# Patient Record
Sex: Male | Born: 1985 | Race: Black or African American | Hispanic: No | Marital: Single | Smoking: Current every day smoker
Health system: Southern US, Community
[De-identification: ages and names within clinical notes are randomized; demographics above are authoritative.]

## PROBLEM LIST (undated history)

## (undated) DIAGNOSIS — F329 Major depressive disorder, single episode, unspecified: Secondary | ICD-10-CM

## (undated) DIAGNOSIS — F32A Depression, unspecified: Secondary | ICD-10-CM

---

## 2018-10-08 ENCOUNTER — Encounter (HOSPITAL_COMMUNITY): Payer: Self-pay

## 2018-10-08 ENCOUNTER — Emergency Department (HOSPITAL_COMMUNITY)
Admission: EM | Admit: 2018-10-08 | Discharge: 2018-10-09 | Disposition: A | Discharge status: Other Acute Inpatient Hospital | Payer: Self-pay | Attending: Emergency Medicine | Admitting: Emergency Medicine

## 2018-10-08 ENCOUNTER — Other Ambulatory Visit: Payer: Self-pay

## 2018-10-08 DIAGNOSIS — F32A Depression, unspecified: Secondary | ICD-10-CM

## 2018-10-08 DIAGNOSIS — F17210 Nicotine dependence, cigarettes, uncomplicated: Secondary | ICD-10-CM | POA: Insufficient documentation

## 2018-10-08 DIAGNOSIS — R45851 Suicidal ideations: Secondary | ICD-10-CM | POA: Insufficient documentation

## 2018-10-08 DIAGNOSIS — F1099 Alcohol use, unspecified with unspecified alcohol-induced disorder: Secondary | ICD-10-CM | POA: Insufficient documentation

## 2018-10-08 DIAGNOSIS — F1910 Other psychoactive substance abuse, uncomplicated: Secondary | ICD-10-CM | POA: Insufficient documentation

## 2018-10-08 DIAGNOSIS — F329 Major depressive disorder, single episode, unspecified: Secondary | ICD-10-CM | POA: Insufficient documentation

## 2018-10-08 LAB — COMPREHENSIVE METABOLIC PANEL
ALK PHOS: 45 U/L (ref 38–126)
ALT: 11 U/L (ref 0–44)
AST: 16 U/L (ref 15–41)
Albumin: 4.2 g/dL (ref 3.5–5.0)
Anion gap: 8 (ref 5–15)
BUN: 16 mg/dL (ref 6–20)
CO2: 28 mmol/L (ref 22–32)
CREATININE: 1.16 mg/dL (ref 0.61–1.24)
Calcium: 9 mg/dL (ref 8.9–10.3)
Chloride: 104 mmol/L (ref 98–111)
GFR calc Af Amer: >60 mL/min (ref >60)
GFR calc non Af Amer: >60 mL/min (ref >60)
Glucose, Bld: 80 mg/dL (ref 70–99)
Potassium: 3.8 mmol/L (ref 3.5–5.1)
Sodium: 140 mmol/L (ref 135–145)
Total Bilirubin: 1 mg/dL (ref 0.3–1.2)
Total Protein: 7.3 g/dL (ref 6.5–8.1)

## 2018-10-08 LAB — CBC
HEMATOCRIT: 43.4 % (ref 39.0–52.0)
Hemoglobin: 14.1 g/dL (ref 13.0–17.0)
MCH: 29.6 pg (ref 26.0–34.0)
MCHC: 32.5 g/dL (ref 30.0–36.0)
MCV: 91.2 fL (ref 80.0–100.0)
Platelets: 218 10*3/uL (ref 150–400)
RBC: 4.76 MIL/uL (ref 4.22–5.81)
RDW: 11.9 % (ref 11.5–15.5)
WBC: 3.4 10*3/uL — ABNORMAL LOW (ref 4.0–10.5)
nRBC: 0 % (ref 0.0–0.2)

## 2018-10-08 LAB — RAPID URINE DRUG SCREEN, HOSP PERFORMED
Amphetamines: NOT DETECTED
Barbiturates: NOT DETECTED
Benzodiazepines: NOT DETECTED
Cocaine: POSITIVE — AB
Opiates: NOT DETECTED
Tetrahydrocannabinol: NOT DETECTED

## 2018-10-08 LAB — SALICYLATE LEVEL: Salicylate Lvl: <7 mg/dL (ref 2.8–30.0)

## 2018-10-08 LAB — ACETAMINOPHEN LEVEL: Acetaminophen (Tylenol), Serum: <10 ug/mL — ABNORMAL LOW (ref 10–30)

## 2018-10-08 LAB — ETHANOL: Alcohol, Ethyl (B): <10 mg/dL (ref <10)

## 2018-10-08 MED ORDER — ACETAMINOPHEN 325 MG PO TABS: 650.0000 mg | ORAL_TABLET | ORAL | Status: DC | PRN

## 2018-10-08 MED ORDER — NICOTINE 21 MG/24HR TD PT24: 21.0000 mg | MEDICATED_PATCH | Freq: Every day | TRANSDERMAL | Status: DC

## 2018-10-08 MED ORDER — ALUM & MAG HYDROXIDE-SIMETH 200-200-20 MG/5ML PO SUSP: 30.0000 mL | Freq: Four times a day (QID) | ORAL | Status: DC | PRN

## 2018-10-08 NOTE — ED Provider Notes (Signed)
[facility] EMERGENCY DEPARTMENT Provider Note   CSN: [id number] Arrival date & time: 10/08/18  1127    History   Chief Complaint Chief Complaint  Patient presents with  . V70.1    HPI Keith Patton is a 33 y.o. male.     Patient reports depression for uncertain length of time with questionable suicidal ideation.  He does not have a specific plan.  He also states it is unlikely he would ever carry it out.  1 previous psychiatric admission for depression when he was in [employer] approximately 5 years ago.  He is a daily drinker and uses cocaine.  Severity is moderate.     History reviewed. No pertinent past medical history.  There are no active problems to display for this patient.   History reviewed. No pertinent surgical history.      Home Medications    Prior to Admission medications   Not on File    Family History No family history on file.  Social History Social History   Tobacco Use  . Smoking status: Current Every Day Smoker    Packs/day: 0.50  . Smokeless tobacco: Never Used  Substance Use Topics  . Alcohol use: Yes    Comment: Everyday. 6-12 beers  . Drug use: Yes    Types: Cocaine    Comment: Everyday. Has not used today. Last used Sunday.      Allergies   Patient has no allergy information on record.   Review of Systems Review of Systems  All other systems reviewed and are negative.    Physical Exam Updated Vital Signs BP 116/73 (BP Location: Right Arm)   Pulse 75   Temp 98.3 F (36.8 C) (Oral)   Resp 16   Ht 6\' 2"  (1.88 m)   Wt 79.4 kg   SpO2 100%   BMI 22.47 kg/m   Physical Exam Vitals signs and nursing note reviewed.  Constitutional:      Appearance: He is well-developed.  HENT:     Head: Normocephalic and atraumatic.  Eyes:     Conjunctiva/sclera: Conjunctivae normal.  Neck:     Musculoskeletal: Neck supple.  Cardiovascular:     Rate and Rhythm: Normal rate and regular rhythm.  Pulmonary:     Effort:  Pulmonary effort is normal.     Breath sounds: Normal breath sounds.  Abdominal:     General: Bowel sounds are normal.     Palpations: Abdomen is soft.  Musculoskeletal: Normal range of motion.  Skin:    General: Skin is warm and dry.  Neurological:     Mental Status: He is alert and oriented to person, place, and time.  Psychiatric:     Comments: depressed      ED Treatments / Results  Labs (all labs ordered are listed, but only abnormal results are displayed) Labs Reviewed  CBC - Abnormal; Notable for the following components:      Result Value   WBC 3.4 (*)    All other components within normal limits  RAPID URINE DRUG SCREEN, HOSP PERFORMED - Abnormal; Notable for the following components:   Cocaine POSITIVE (*)    All other components within normal limits  COMPREHENSIVE METABOLIC PANEL  ETHANOL  SALICYLATE LEVEL  ACETAMINOPHEN LEVEL    EKG None  Radiology No results found.  Procedures Procedures (including critical care time)  Medications Ordered in ED Medications - No data to display   Initial Impression / Assessment and Plan / ED Course  I have reviewed the triage vital signs and the nursing notes.  Pertinent labs & imaging results that were available during my care of the patient were reviewed by me and considered in my medical decision making (see chart for details).        Patient complains of depression and substance abuse.  He is not psychotic.  Will obtain behavioral health consult.  Final Clinical Impressions(s) / ED Diagnoses   Final diagnoses:  Depression, unspecified depression type    ED Discharge Orders    None       Donnetta Hutching, MD 10/08/18 1342
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
## 2018-10-08 NOTE — ED Notes (Signed)
Security at bedside to wand. Belongings given to pt's friend who will take them. Pt's friend with purse and other personal belongings. Asked by security to take to vehicle.

## 2018-10-08 NOTE — ED Notes (Signed)
TTS consult in process. 

## 2018-10-08 NOTE — ED Triage Notes (Signed)
Pt has been having suicidal thoughts for the last few weeks. Has a history of mental health issues. Pt states he has had triggers previously for this, but will not disclose that information. Pt has not made any plan.

## 2018-10-09 ENCOUNTER — Other Ambulatory Visit: Payer: Self-pay

## 2018-10-09 ENCOUNTER — Encounter (HOSPITAL_COMMUNITY): Payer: Self-pay | Admitting: *Deleted

## 2018-10-09 ENCOUNTER — Inpatient Hospital Stay (HOSPITAL_COMMUNITY)
Admission: AD | Admit: 2018-10-09 | Discharge: 2018-10-13 | DRG: 885 | Disposition: A | Discharge status: Home / Self Care | Payer: Federal, State, Local not specified - Other | Source: Intra-hospital | Attending: Psychiatry | Admitting: Psychiatry

## 2018-10-09 DIAGNOSIS — F10239 Alcohol dependence with withdrawal, unspecified: Secondary | ICD-10-CM | POA: Diagnosis present

## 2018-10-09 DIAGNOSIS — R45851 Suicidal ideations: Secondary | ICD-10-CM | POA: Diagnosis present

## 2018-10-09 DIAGNOSIS — F1410 Cocaine abuse, uncomplicated: Secondary | ICD-10-CM | POA: Diagnosis present

## 2018-10-09 DIAGNOSIS — F322 Major depressive disorder, single episode, severe without psychotic features: Secondary | ICD-10-CM | POA: Diagnosis not present

## 2018-10-09 DIAGNOSIS — F17210 Nicotine dependence, cigarettes, uncomplicated: Secondary | ICD-10-CM | POA: Diagnosis present

## 2018-10-09 DIAGNOSIS — F339 Major depressive disorder, recurrent, unspecified: Principal | ICD-10-CM | POA: Diagnosis present

## 2018-10-09 DIAGNOSIS — F1010 Alcohol abuse, uncomplicated: Secondary | ICD-10-CM

## 2018-10-09 HISTORY — DX: Depression, unspecified: F32.A

## 2018-10-09 HISTORY — DX: Major depressive disorder, single episode, unspecified: F32.9

## 2018-10-09 MED ORDER — MAGNESIUM HYDROXIDE 400 MG/5ML PO SUSP: 30.0000 mL | Freq: Every day | ORAL | Status: DC | PRN

## 2018-10-09 MED ORDER — CHLORDIAZEPOXIDE HCL 25 MG PO CAPS
25.0000 mg | ORAL_CAPSULE | ORAL | Status: AC
  Administered 2018-10-11 – 2018-10-12 (×2): 25 mg via ORAL
  Filled 2018-10-09 (×2): qty 1

## 2018-10-09 MED ORDER — HYDROXYZINE HCL 25 MG PO TABS
25.0000 mg | ORAL_TABLET | Freq: Four times a day (QID) | ORAL | Status: AC | PRN
  Administered 2018-10-11: 25 mg via ORAL
  Filled 2018-10-09 (×2): qty 1

## 2018-10-09 MED ORDER — VITAMIN B-1 100 MG PO TABS
100.0000 mg | ORAL_TABLET | Freq: Every day | ORAL | Status: DC
  Administered 2018-10-10 – 2018-10-13 (×4): 100 mg via ORAL
  Filled 2018-10-09 (×6): qty 1

## 2018-10-09 MED ORDER — CHLORDIAZEPOXIDE HCL 25 MG PO CAPS
25.0000 mg | ORAL_CAPSULE | Freq: Three times a day (TID) | ORAL | Status: AC
  Administered 2018-10-11: 25 mg via ORAL
  Filled 2018-10-09 (×2): qty 1

## 2018-10-09 MED ORDER — CHLORDIAZEPOXIDE HCL 25 MG PO CAPS: 25.0000 mg | ORAL_CAPSULE | Freq: Four times a day (QID) | ORAL | Status: AC | PRN

## 2018-10-09 MED ORDER — ADULT MULTIVITAMIN W/MINERALS CH
1.0000 | ORAL_TABLET | Freq: Every day | ORAL | Status: DC
  Administered 2018-10-09 – 2018-10-13 (×5): 1 via ORAL
  Filled 2018-10-09 (×7): qty 1

## 2018-10-09 MED ORDER — LOPERAMIDE HCL 2 MG PO CAPS: 2.0000 mg | ORAL_CAPSULE | ORAL | Status: AC | PRN

## 2018-10-09 MED ORDER — ALUM & MAG HYDROXIDE-SIMETH 200-200-20 MG/5ML PO SUSP: 30.0000 mL | ORAL | Status: DC | PRN

## 2018-10-09 MED ORDER — ONDANSETRON 4 MG PO TBDP: 4.0000 mg | ORAL_TABLET | Freq: Four times a day (QID) | ORAL | Status: AC | PRN

## 2018-10-09 MED ORDER — BUPROPION HCL ER (XL) 150 MG PO TB24
150.0000 mg | ORAL_TABLET | Freq: Every day | ORAL | Status: DC
  Administered 2018-10-09 – 2018-10-11 (×3): 150 mg via ORAL
  Filled 2018-10-09 (×4): qty 1

## 2018-10-09 MED ORDER — ENSURE ENLIVE PO LIQD
237.0000 mL | Freq: Two times a day (BID) | ORAL | Status: DC
  Administered 2018-10-10 – 2018-10-13 (×8): 237 mL via ORAL

## 2018-10-09 MED ORDER — HYDROXYZINE HCL 25 MG PO TABS
25.0000 mg | ORAL_TABLET | Freq: Three times a day (TID) | ORAL | Status: DC | PRN
  Administered 2018-10-10 – 2018-10-12 (×2): 25 mg via ORAL
  Filled 2018-10-09: qty 1

## 2018-10-09 MED ORDER — CHLORDIAZEPOXIDE HCL 25 MG PO CAPS
25.0000 mg | ORAL_CAPSULE | Freq: Four times a day (QID) | ORAL | Status: AC
  Administered 2018-10-09 – 2018-10-10 (×3): 25 mg via ORAL
  Filled 2018-10-09 (×3): qty 1

## 2018-10-09 MED ORDER — TRAZODONE HCL 50 MG PO TABS
50.0000 mg | ORAL_TABLET | Freq: Every evening | ORAL | Status: DC | PRN
  Administered 2018-10-10 – 2018-10-12 (×3): 50 mg via ORAL
  Filled 2018-10-09 (×3): qty 1

## 2018-10-09 MED ORDER — CHLORDIAZEPOXIDE HCL 25 MG PO CAPS
25.0000 mg | ORAL_CAPSULE | Freq: Every day | ORAL | Status: AC
  Administered 2018-10-13: 25 mg via ORAL
  Filled 2018-10-09: qty 1

## 2018-10-09 MED ORDER — ACETAMINOPHEN 325 MG PO TABS: 650.0000 mg | ORAL_TABLET | Freq: Four times a day (QID) | ORAL | Status: DC | PRN

## 2018-10-09 MED ORDER — THIAMINE HCL 100 MG/ML IJ SOLN: 100.0000 mg | Freq: Once | INTRAMUSCULAR | Status: DC

## 2018-10-09 NOTE — ED Notes (Signed)
TTS consult in progress. °

## 2018-10-09 NOTE — Progress Notes (Signed)
Keith Patton is a 33 year old male pt admitted on voluntary basis. On admission, he does endorse depression, substance abuse and suicidal thoughts but able to contract for safety in the hospital. He reports that he has been having thoughts but has not actually done anything. He also reports daily alcohol usage and also reports frequent cocaine use. He reports that his last drink was two days ago and he does not display any overt signs or symptoms of withdrawal on admission. 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 He also spoke about a recent breakup of a relationship. He reports that he was hospitalized once prior in 2016 and was also on medications at that time but reports that he has not taken them since then. He reports that he lives alone and reports that he will return there once he is discharged. Keith Patton was escorted to the unit, oriented to the milieu and safety maintained.

## 2018-10-09 NOTE — ED Notes (Signed)
[facility] here to transport patient to [facility]. 

## 2018-10-09 NOTE — Progress Notes (Signed)
Pt accepted to [facility], Bed 300-1 Reola Calkins, NP is the accepting provider.  Nehemiah Massed, NP is the attending provider.  Call report to [phone]  [facility] notified.   Pt is Voluntary.  Pt may be transported by [facility]  Pt scheduled  to arrive at [facility] at 14:30  Carney Bern T. Kaylyn Lim, MSW, LCSWA Disposition Clinical Social Work [phone] (cell) [phone] (office)

## 2018-10-09 NOTE — Progress Notes (Signed)
Nursing Progress Note: 7p-7a D: Pt currently presents with a pleasant/anxious affect and behavior. Pt states "I am feeling better. I just gotta stay off of drugs. I need to regain on my drug weight loss and get my shit together." Interacting appropriately with the milieu. Pt reports good sleep during the previous night with current medication regimen. Pt did attend wrap-up group.  A: Pt provided with medications per providers orders. Pt's labs and vitals were monitored throughout the night. Pt supported emotionally and encouraged to express concerns and questions. Pt educated on medications.  R: Pt's safety ensured with 15 minute and environmental checks. Pt currently denies SI, HI, and AVH. Pt verbally contracts to seek staff if SI,HI, or AVH occurs and to consult with staff before acting on any harmful thoughts. Will continue to monitor.

## 2018-10-09 NOTE — Progress Notes (Signed)
Patient is seen by me via tele-psych and I have consulted with Dr. Lucianne Muss.  Patient reports long history of depression.  He reports that over the last 2 weeks his alcohol intake and cocaine use has increased.  He states he drinks alcohol daily of approximately 6-8 beers a day on average along with cocaine use 2-3 times a week.  He reports that over the last week he has had suicidal thoughts daily.  He reports that he did go to the [place] to seek assistance and was told for me to help to come to the hospital.  Patient seems to be reaching out for help and patient is open to coming into the hospital voluntarily to seek treatment.  Due to patient's risk factors of increased suicidal thoughts over the last week, substance abuse, and seeking help at multiple facilities feel the patient would best benefit from being inpatient and patient agrees.

## 2018-10-09 NOTE — Tx Team (Signed)
Initial Treatment Plan 10/09/2018 2:47 PM Keith Patton OXB:[id number]    PATIENT STRESSORS: Loss of relationship Occupational concerns   PATIENT STRENGTHS: Ability for insight Average or above average intelligence Capable of independent living Communication skills General fund of knowledge Motivation for treatment/growth Physical Health   PATIENT IDENTIFIED PROBLEMS: Depression Suicidal thoughts Substance Abuse "find the root cause of this depression" "I would like to be sober"                     DISCHARGE CRITERIA:  Ability to meet basic life and health needs Improved stabilization in mood, thinking, and/or behavior Reduction of life-threatening or endangering symptoms to within safe limits Verbal commitment to aftercare and medication compliance  PRELIMINARY DISCHARGE PLAN: Attend aftercare/continuing care group Placement in alternative living arrangements  PATIENT/FAMILY INVOLVEMENT: This treatment plan has been presented to and reviewed with the patient, Keith Patton, and/or family member, .  The patient and family have been given the opportunity to ask questions and make suggestions.  Keith Patton, [place], [place] 10/09/2018, 2:47 PM

## 2018-10-09 NOTE — Progress Notes (Signed)
Pt attend wrap up group. 

## 2018-10-10 DIAGNOSIS — F322 Major depressive disorder, single episode, severe without psychotic features: Secondary | ICD-10-CM

## 2018-10-10 LAB — TSH: TSH: 1.563 u[IU]/mL (ref 0.350–4.500)

## 2018-10-10 NOTE — [facility] Counselor (Signed)
Adult Comprehensive Assessment  Patient ID: Keymoni Kamradt, male   DOB: [birth year], 33 y.o.   MRN: [id number]  Information Source: Information source: Patient  Current Stressors:  Patient states their primary concerns and needs for treatment are:: increased depression over past 1-2 mo, weight loss, isolating from friends/family, increased cocaine/thc/alcohol abuse Patient states their goals for this hospitilization and ongoing recovery are:: "to get to the root cause of my depression and to get sober."  Educational / Learning stressors: high school education Employment / Job issues: Production designer, theatre/television/film at [employer] Family Relationships: parents are deceased; younger brother just graduated from college-- some contact. son  Surveyor, quantity / Lack of resources (include bankruptcy): income from employment. working on getting connected to [employer] / Lack of housing: lives alone in apt in [place], [place] Physical health (include injuries & life threatening diseases): none identified Social relationships: fair-some friends in the area. son lives out of state. some interaction with brother; parents deceased. single Substance abuse: pt reports that he has been drinking heavily and using cocaine 3-4x weekly for the past few months. some marijuana use as well. Pt reports drug/alcohol use was rare prior to 2 months ago Bereavement / Loss: mother died 2016/12/17 and father died in 12-18-11  Living/Environment/Situation:  Living Arrangements: Alone Living conditions (as described by patient or guardian): lives alone in apt Who else lives in the home?: alone` How long has patient lived in current situation?: 1 1/2 years What is atmosphere in current home: Comfortable  Family History:  Marital status: Single Are you sexually active?: Yes What is your sexual orientation?: heterosexual Has your sexual activity been affected by drugs, alcohol, medication, or emotional stress?: n/a  Does patient have children?: Yes How many children?:  1 How is patient's relationship with their children?: 33yo son who lives in [place] with his mother. "I don't get to see him much but I talk to him on the phone."   Childhood History:  By whom was/is the patient raised?: Father Additional childhood history information: "My mom had addiction issues so my dad was my primary caretaker. He was a single dad and didn't really get help from other family. He did the best he could with [place]."  Description of patient's relationship with caregiver when they were a child: close to father; strained from mother due to her addiction issues in his childhood. His mother was not around often Patient's description of current relationship with people who raised him/her: father died 12/18/2011 and mother died in 12/17/16. "my relationship with my mom got better up until her death."  How were you disciplined when you got in trouble as a child/adolescent?: grounded;  Does patient have siblings?: Yes Number of Siblings: 1 Description of patient's current relationship with siblings: younger brother. "He is nine years younger than me. We see each other every now and then."  Did patient suffer any verbal/emotional/physical/sexual abuse as a child?: No Did patient suffer from severe childhood neglect?: No Has patient ever been sexually abused/assaulted/raped as an adolescent or adult?: No Was the patient ever a victim of a crime or a disaster?: No Witnessed domestic violence?: No Has patient been effected by domestic violence as an adult?: No  Education:  Highest grade of school patient has completed: high school graduate  Currently a student?: No Learning disability?: No  Employment/Work Situation:   Employment situation: Employed 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 cocaine 3-4x weekly; marijuana almost daily.  If attempted suicide, did drugs/alcohol play a role in this?: No(increased depression; no SI attempt) Alcohol/Substance Abuse Treatment Hx: Past Tx, Inpatient, Attends AA/NA If yes, describe treatment: [facility] two years ago with similar issues; went to AA while having 90 days sober.  Has alcohol/substance abuse ever caused legal problems?: No  Social Support System:   Patient's Community Support System: Fair Museum/gallery exhibitions officer System: pt reports having a few close friends; ex girlfriend urged him to come get help after seeing his dramatic weightloss.  Type of faith/religion: Baptist-Christian How does patient's faith help to cope with current illness?: "That's a good question. I don't know. I hope my faith will pull me through."   Leisure/Recreation:   Leisure and Hobbies: "I don't enjoy much now but used to like singing kareoke and working out."   Strengths/Needs:   What is the patient's perception of their strengths?: intelligent; managed to maintain 90 days sobriety recently; 8 mo total in the past  Patient states they can use these personal strengths during their treatment to contribute to their recovery: Willing to attend outpatient traetment.  Patient  states these barriers may affect/interfere with their treatment: none identified by pt Patient states these barriers may affect their return to the community: none identified by pt Other important information patient would like considered in planning for their treatment: n/a   Discharge Plan:   Currently receiving community mental health services: No Patient states concerns and preferences for aftercare planning are: pt has been [facility] with getting [facility] but reports feeling like "they don't offer veterans very good service."  Patient states they will know when they are safe and ready for discharge when: when I work on coping skills for my depression and get through detox Does patient have access to transportation?: Yes("I'll have a friend pick me up.") Does patient have financial barriers related to discharge medications?: Yes Patient description of barriers related to discharge medications: limited income; no insurance but is working with [facility] on getting linked for services.  Will patient be returning to same living situation after discharge?: Yes  Summary/Recommendations:   Summary and Recommendations (to be completed by the evaluator): Pt is 32yo male [place] He presents to the hospital seeking treatment for SI, depression, weightloss/poor sleep associated with depression, increased substance use including cocaine, marijuana, and alcohol, and for medication stabilization. Pt denies SI/HI/AVH. He is single, with a 33yo son who lives out of state, and is employed. Pt has a primary diagnosis of MDD,recurrent, severe. He is a Science writer and served for 10 years. Recommendations for pt include: crisis stabilization, therapeutic milieu, encourage group attendance and participation, medication management  for mood stabilization/detox, and development of comprehensive mental wellness/sobriety plan. CSW assessing for appropriate referrals.    Rona RavensHeather S Zaveon Gillen LCSW 10/10/2018 10:30 AM

## 2018-10-10 NOTE — Progress Notes (Signed)
NUTRITION ASSESSMENT  Pt identified as at risk on the Malnutrition Screen Tool  INTERVENTION: 1. Supplements: Continue Ensure Enlive po BID, each supplement provides 350 kcal and 20 grams of protein  NUTRITION DIAGNOSIS: Unintentional weight loss related to sub-optimal intake as evidenced by pt report.   Goal: Pt to meet >/= 90% of their estimated nutrition needs.  Monitor:  PO intake  Assessment:  Patient admitted with depression and substance abuse (ETOH, cocaine). Given increased needs d/t substance abuse, continue Ensure supplements.  Height: Ht Readings from Last 1 Encounters:  10/09/18 6\' 2"  (1.88 m)    Weight: Wt Readings from Last 1 Encounters:  10/09/18 79.4 kg    Weight Hx: Wt Readings from Last 10 Encounters:  10/09/18 79.4 kg  10/08/18 79.4 kg    BMI:  Body mass index is 22.47 kg/m. Pt meets criteria for normal based on current BMI.  Estimated Nutritional Needs: Kcal: 25-30 kcal/kg Protein: > 1 gram protein/kg Fluid: 1 ml/kcal  Diet Order:  Diet Order            Diet regular Room service appropriate? Yes; Fluid consistency: Thin  Diet effective now             Pt is also offered choice of unit snacks mid-morning and mid-afternoon.  Pt is eating as desired.   Lab results and medications reviewed.   Tilda Franco, MS, RD, LDN [facility] Inpatient Clinical Dietitian Pager: [phone] After Hours Pager: [phone]

## 2018-10-10 NOTE — [facility] Suicide Risk Assessment (Signed)
[facility] Admission Suicide Risk Assessment   Nursing information obtained from:  Patient Demographic factors:  Male, Adolescent or young adult, Living alone Current Mental Status:  Self-harm thoughts Loss Factors:  Loss of significant relationship Historical Factors:  Prior suicide attempts, Family history of mental illness or substance abuse Risk Reduction Factors:  Positive therapeutic relationship, Positive coping skills or problem solving skills  Total Time spent with patient: 45 minutes Principal Problem: Cocaine/alcohol use disorders and depression Diagnosis:  Active Problems:   MDD (major depressive disorder), severe (HCC)  Subjective Data: Patient seen motivated for treatment alert oriented cooperative without thoughts of harming self or others without acute withdrawal  Continued Clinical Symptoms:  Alcohol Use Disorder Identification Test Final Score (AUDIT): 25 The "Alcohol Use Disorders Identification Test", Guidelines for Use in Primary Care, Second Edition.  World Science writer [facility]). Score between 0-7:  no or low risk or alcohol related problems. Score between 8-15:  moderate risk of alcohol related problems. Score between 16-19:  high risk of alcohol related problems. Score 20 or above:  warrants further diagnostic evaluation for alcohol dependence and treatment.   CLINICAL FACTORS:   Alcohol/Substance Abuse/Dependencies   COGNITIVE FEATURES THAT CONTRIBUTE TO RISK:  None    SUICIDE RISK:   Minimal: No identifiable suicidal ideation.  Patients presenting with no risk factors but with morbid ruminations; may be classified as minimal risk based on the severity of the depressive symptoms  PLAN OF CARE: Detox underway continue full eval  I certify that inpatient services furnished can reasonably be expected to improve the patient's condition.   Malvin Johns, MD 10/10/2018, 10:00 AM

## 2018-10-10 NOTE — [facility] Group Notes (Signed)
[facility] LCSW Group Therapy Note  Date/Time 10/10/2018 1:15 PM  Type of Therapy/Topic:  Group Therapy:  Feelings about Diagnosis  Participation Level:  Active   Mood: Depressed   Description of Group:    This group will allow patients to explore their thoughts and feelings about diagnoses they have received. Patients will be guided to explore their level of understanding and acceptance of these diagnoses. Facilitator will encourage patients to process their thoughts and feelings about the reactions of others to their diagnosis, and will guide patients in identifying ways to discuss their diagnosis with significant others in their lives. This group will be process-oriented, with patients participating in exploration of their own experiences as well as giving and receiving support and challenge from other group members.   Therapeutic Goals: 1. Patient will demonstrate understanding of diagnosis as evidence by identifying two or more symptoms of the disorder:  2. Patient will be able to express two feelings regarding the diagnosis 3. Patient will demonstrate ability to communicate their needs through discussion and/or role plays  Summary of Patient Progress: Keith Patton attended the entire session. He shared that it has been scary to learn about his diagnosis.  He realized that alcohol is a depressant which made his diagnosis worse. [name] stated that he can use a therapist "to talk it out".   Therapeutic Modalities:   Cognitive Behavioral Therapy Brief Therapy Feelings Identification   [name], MSW Intern 10/10/2018 3:52

## 2018-10-10 NOTE — [facility] Suicide Risk Assessment (Signed)
[facility] INPATIENT:  Family/Significant Other Suicide Prevention Education  Suicide Prevention Education:  Patient Refusal for Family/Significant Other Suicide Prevention Education: The patient Keith Patton has refused to provide written consent for family/significant other to be provided Family/Significant Other Suicide Prevention Education during admission and/or prior to discharge.  Physician notified.  SPE completed with pt, as pt refused to consent to family contact. SPI pamphlet provided to pt and pt was encouraged to share information with support network, ask questions, and talk about any concerns relating to SPE. Pt denies access to guns/firearms and verbalized understanding of information provided. Mobile Crisis information also provided to pt.   Ethel Rana Robin Petrakis LCSW 10/10/2018, 10:30 AM

## 2018-10-10 NOTE — Progress Notes (Signed)
Pt said his day was a 6. The one good thing that happen today he had a visitor. This was a good visit.

## 2018-10-10 NOTE — Plan of Care (Signed)
Progress note  D: pt found in the hallway; compliant with medication administration. Pt states he slept ok. Pt denies any physical pain or symptoms. Pt still seems depressed/sullen about his situation. Pt attended groups and has been in the milieu throughout the day. Pt is animated with interaction. Pt denies si/hi/ah/vh and verbally agrees to approach staff if these become apparent or before harming himself/others while at [facility]. A: pt provided support and encouragement. Pt given medication per protocol and standing orders. Q43m safety checks implemented and continued.  R: pt safe on the unit. Will continue to monitor.   Pt progressing in the following metrics  Problem: Education: Goal: Knowledge of [place] [facility] Education information/materials will improve Outcome: Progressing Goal: Emotional status will improve Outcome: Progressing Goal: Mental status will improve Outcome: Progressing Goal: Verbalization of understanding the information provided will improve Outcome: Progressing

## 2018-10-10 NOTE — H&P (Signed)
Psychiatric Admission Assessment Adult  Patient Identification: Keith GrateDatuan Patton MRN:  [id number] Date of Evaluation:  10/10/2018 Chief Complaint:  MDD WITH SUICIDAL IDEATION Principal Diagnosis: Depression/cocaine and alcohol use disorders Diagnosis:  Active Problems:   MDD (major depressive disorder), severe (HCC)  History of Present Illness:   This is the first psychiatric admission here, the second lifetime admission for Mr. Keith Patton, a 33 year old single veteran who reports a history of depression that has been responsive in the past to Wellbutrin, however recent break-up and recent escalation in alcohol usage to daily dependency, and cocaine binging several times a week contributed to further depression, despondency, and recent suicidal thinking without specific plans. The patient's history is consistent from examiner to examiner  There are no acute withdrawal symptoms or involuntary movements. Does not report cocaine cravings or dreams of use  He is alert and oriented to person place time day and situation, is generally cooperative pleasant to deal with denies specific plans of harming himself can contract for safety here but has had intermittent suicidal thoughts. No thoughts of harming others No psychosis, no psychosis when abusing cocaine either   Associated Signs/Symptoms: Depression Symptoms:  depressed mood, (Hypo) Manic Symptoms:  n/a Anxiety Symptoms:  n/a Psychotic Symptoms:  n/a PTSD Symptoms: NA Total Time spent with patient: 45 minutes  Past Psychiatric History: [facility], again believes that Wellbutrin has been helpful and requests no change in that medication  Is the patient at risk to self? Yes.    Has the patient been a risk to self in the past 6 months? No.  Has the patient been a risk to self within the distant past? No.  Is the patient a risk to others? No.  Has the patient been a risk to others in the past 6 months? No.  Has the  patient been a risk to others within the distant past? No.   Alcohol Screening: 1. How often do you have a drink containing alcohol?: 4 or more times a week 2. How many drinks containing alcohol do you have on a typical day when you are drinking?: 7, 8, or 9 3. How often do you have six or more drinks on one occasion?: Daily or almost daily AUDIT-C Score: 11 4. How often during the last year have you found that you were not able to stop drinking once you had started?: Weekly 5. How often during the last year have you failed to do what was normally expected from you becasue of drinking?: Never 6. How often during the last year have you needed a first drink in the morning to get yourself going after a heavy drinking session?: Weekly 7. How often during the last year have you had a feeling of guilt of remorse after drinking?: Daily or almost daily 8. How often during the last year have you been unable to remember what happened the night before because you had been drinking?: Never 9. Have you or someone else been injured as a result of your drinking?: No 10. Has a relative or friend or a doctor or another health worker been concerned about your drinking or suggested you cut down?: Yes, during the last year Alcohol Use Disorder Identification Test Final Score (AUDIT): 25 Alcohol Brief Interventions/Follow-up: Alcohol Education Substance Abuse History in the last 12 months:  Yes.   Consequences of Substance Abuse: Medical Consequences:  Has become dependent Previous Psychotropic Medications: Yes  Psychological Evaluations: No  Past Medical History:  Past Medical  History:  Diagnosis Date  . Depression    History reviewed. No pertinent surgical history. Family History: History reviewed. No pertinent family history. Family Psychiatric  History: neg Tobacco Screening: Have you used any form of tobacco in the last 30 days? (Cigarettes, Smokeless Tobacco, Cigars, and/or Pipes): Yes Tobacco use,  Select all that apply: 5 or more cigarettes per day Are you interested in Tobacco Cessation Medications?: No, patient refused Counseled patient on smoking cessation including recognizing danger situations, developing coping skills and basic information about quitting provided: Refused/Declined practical counseling Social History:  Social History   Substance and Sexual Activity  Alcohol Use Yes   Comment: Everyday. 6-12 beers     Social History   Substance and Sexual Activity  Drug Use Yes  . Types: Cocaine   Comment: Everyday. Has not used today. Last used Sunday.     Additional Social History:                           Allergies:  No Known Allergies Lab Results:  Results for orders placed or performed during the hospital encounter of 10/09/18 (from the past 48 hour(s))  TSH     Status: None   Collection Time: 10/10/18  6:30 AM  Result Value Ref Range   TSH 1.563 0.350 - 4.500 uIU/mL    Comment: Performed by a 3rd Generation assay with a functional sensitivity of <=0.01 uIU/mL. Performed at [facility], [address], [place], [place] [address]     Blood Alcohol level:  Lab Results  Component Value Date   ETH <10 10/08/2018    Metabolic Disorder Labs:  No results found for: HGBA1C, MPG No results found for: PROLACTIN No results found for: CHOL, TRIG, HDL, CHOLHDL, VLDL, LDLCALC  Current Medications: Current Facility-Administered Medications  Medication Dose Route Frequency Provider Last Rate Last Dose  . acetaminophen (TYLENOL) tablet 650 mg  650 mg Oral Q6H PRN Money, Gerlene Burdock, FNP      . alum & mag hydroxide-simeth (MAALOX/MYLANTA) 200-200-20 MG/5ML suspension 30 mL  30 mL Oral Q4H PRN Money, Feliz Beam B, FNP      . buPROPion (WELLBUTRIN XL) 24 hr tablet 150 mg  150 mg Oral Daily Money, Gerlene Burdock, FNP   150 mg at 10/10/18 0756  . chlordiazePOXIDE (LIBRIUM) capsule 25 mg  25 mg Oral Q6H PRN Money, Gerlene Burdock, FNP      . chlordiazePOXIDE  (LIBRIUM) capsule 25 mg  25 mg Oral QID Money, Gerlene Burdock, FNP   25 mg at 10/10/18 7867   Followed by  . chlordiazePOXIDE (LIBRIUM) capsule 25 mg  25 mg Oral TID Money, Gerlene Burdock, FNP       Followed by  . [START ON 10/11/2018] chlordiazePOXIDE (LIBRIUM) capsule 25 mg  25 mg Oral BH-qamhs Money, Gerlene Burdock, FNP       Followed by  . [START ON 10/13/2018] chlordiazePOXIDE (LIBRIUM) capsule 25 mg  25 mg Oral Daily Money, Gerlene Burdock, FNP      . feeding supplement (ENSURE ENLIVE) (ENSURE ENLIVE) liquid 237 mL  237 mL Oral BID BM Cobos, Rockey Situ, MD   237 mL at 10/10/18 1000  . hydrOXYzine (ATARAX/VISTARIL) tablet 25 mg  25 mg Oral TID PRN Money, Gerlene Burdock, FNP      . hydrOXYzine (ATARAX/VISTARIL) tablet 25 mg  25 mg Oral Q6H PRN Money, Feliz Beam B, FNP      . loperamide (IMODIUM) capsule 2-4 mg  2-4 mg Oral PRN Money, Gerlene Burdock, FNP      . magnesium hydroxide (MILK OF MAGNESIA) suspension 30 mL  30 mL Oral Daily PRN Money, Gerlene Burdock, FNP      . multivitamin with minerals tablet 1 tablet  1 tablet Oral Daily Money, Gerlene Burdock, FNP   1 tablet at 10/10/18 0756  . ondansetron (ZOFRAN-ODT) disintegrating tablet 4 mg  4 mg Oral Q6H PRN Money, Gerlene Burdock, FNP      . thiamine (B-1) injection 100 mg  100 mg Intramuscular Once Money, Feliz Beam B, FNP      . thiamine (VITAMIN B-1) tablet 100 mg  100 mg Oral Daily Money, Gerlene Burdock, FNP   100 mg at 10/10/18 0756  . traZODone (DESYREL) tablet 50 mg  50 mg Oral QHS PRN Money, Gerlene Burdock, FNP       PTA Medications: No medications prior to admission.    Musculoskeletal: Strength & Muscle Tone: within normal limits Gait & Station: normal Patient leans: N/A  Psychiatric Specialty Exam: Physical Exam  ROS  Blood pressure 115/87, pulse 76, temperature 98.5 F (36.9 C), temperature source Oral, resp. rate 18, height 6\' 2"  (1.88 m), weight 79.4 kg.Body mass index is 22.47 kg/m.  General Appearance: Casual  Eye Contact:  Good  Speech:  Clear and Coherent  Volume:  Normal  Mood:   Dysphoric  Affect:  Full Range  Thought Process:  Coherent  Orientation:  Full (Time, Place, and Person)  Thought Content:  Logical  Suicidal Thoughts:  Positive -no plans at present contracts here  Homicidal Thoughts:  No  Memory:  Immediate;   Good  Judgement:  Good  Insight:  Good  Psychomotor Activity:  Normal  Concentration:  Concentration: Good  Recall:  Good  Fund of Knowledge:  nl  Language:  Negative  Akathisia:  Negative  Handed:  Right  AIMS (if indicated):     Assets:  Communication Skills Desire for Improvement Physical Health Resilience Social Support Talents/Skills  ADL's:  Intact  Cognition:  WNL  Sleep:  Number of Hours: 6.25    Treatment Plan Summary: Daily contact with patient to assess and evaluate symptoms and progress in treatment, Medication management and Plan Continue Wellbutrin begin detox protocol offered medications for cravings states he does not require them  Observation Level/Precautions:  15 minute checks  Laboratory:  UDS  Psychotherapy: Cognitive and rehab based  Medications: See orders  Consultations: None necessary  Discharge Concerns: Longer-term sobriety  Estimated LOS: 5 days  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, cocaine dependency, alcohol dependency   Physician Treatment Plan for Primary Diagnosis: <principal problem not specified> Long Term Goal(s): Improvement in symptoms so as ready for discharge  Short Term Goals: Compliance with prescribed medications will improve and Ability to identify triggers associated with substance abuse/mental health issues will improve  Physician Treatment Plan for Secondary Diagnosis: Active Problems:   MDD (major depressive disorder), severe (HCC)  Long Term Goal(s): Improvement in symptoms so as ready for discharge  Short Term Goals: Ability to maintain clinical measurements within normal limits will improve and Compliance with prescribed medications will improve  I  certify that inpatient services furnished can reasonably be expected to improve the patient's condition.    Malvin Johns, MD 2/20/202010:02 AM

## 2018-10-11 MED ORDER — BUPROPION HCL ER (XL) 300 MG PO TB24
300.0000 mg | ORAL_TABLET | Freq: Every day | ORAL | Status: DC
  Administered 2018-10-12 – 2018-10-13 (×2): 300 mg via ORAL
  Filled 2018-10-11 (×4): qty 1

## 2018-10-11 NOTE — Plan of Care (Addendum)
Patient self inventory- Patient slept fair last night, sleep medication was requested and was not helpful. Appetite is good, energy level normal, and concentration is good. Depression, hopelessness, and anxiety are all rated 3/10. Denies w/d symptoms. Denies physical pain. Patient's goal is "make the most of what's offered." Deneis SI HI AVH.  Patient is compliant with medications prescribed per provider. No side effects noted. Safety is maintained with 15 minute checks as well as environmental checks. Will continue to monitor.  Problem: Education: Goal: Emotional status will improve Outcome: Progressing Goal: Mental status will improve Outcome: Progressing Goal: Verbalization of understanding the information provided will improve Outcome: Progressing   Problem: Activity: Goal: Interest or engagement in activities will improve Outcome: Progressing Goal: Sleeping patterns will improve Outcome: Progressing   Problem: Coping: Goal: Ability to verbalize frustrations and anger appropriately will improve Outcome: Progressing

## 2018-10-11 NOTE — Progress Notes (Signed)
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 Patient needs to follow up with social work to address this issue.

## 2018-10-11 NOTE — [facility] Group Notes (Signed)
LCSW Group Therapy Note  10/11/2018 1:15pm  Type of Therapy and Topic:  Group Therapy: Avoiding Self-Sabotaging and Enabling Behaviors  Participation Level:  Active   Description of Group:   In this group, patients will learn how to identify obstacles, self-sabotaging and enabling behaviors, as well as: what are they, why do we do them and what needs these behaviors meet. Discuss unhealthy relationships and how to have positive healthy boundaries with those that sabotage and enable. Explore aspects of self-sabotage and enabling in yourself and how to limit these self-destructive behaviors in everyday life.   Therapeutic Goals: 1. Patient will identify one obstacle that relates to self-sabotage and enabling behaviors 2. Patient will identify one personal self-sabotaging or enabling behavior they did prior to admission 3. Patient will state a plan to change the above identified behavior 4. Patient will demonstrate ability to communicate their needs through discussion and/or role play.   Summary of Patient Progress: Keith Patton attended the entire group. He recognized self sabotage as ruining good things in life. He used relationships as an example. He plans to focus on what is working as he moves forward.    Therapeutic Modalities:   Cognitive Behavioral Therapy Person-Centered Therapy Motivational Interviewing   [name], MSW Intern 10/11/2018 2:46 PM

## 2018-10-11 NOTE — Progress Notes (Signed)
[facility] MD Progress Note  10/11/2018 8:12 AM Keith Patton  MRN:  [id number] Subjective:   Patient reports that he slept well he does not have withdrawal symptoms believes that his detox is progressing adequately we discussed escalating his antidepressant dose he has no cravings today can contract for safety here, will probably stay another day to finish detox and adjust escalation of antidepressant continue cognitive and rehab based therapies  Principal Problem: Alcohol and cocaine abuse/dependencies, recurrent depression Diagnosis: Active Problems:   MDD (major depressive disorder), severe (HCC)  Total Time spent with patient: 20 minutes  Past Medical History:  Past Medical History:  Diagnosis Date  . Depression    History reviewed. No pertinent surgical history. Family History: History reviewed. No pertinent family history.  Social History:  Social History   Substance and Sexual Activity  Alcohol Use Yes   Comment: Everyday. 6-12 beers     Social History   Substance and Sexual Activity  Drug Use Yes  . Types: Cocaine   Comment: Everyday. Has not used today. Last used Sunday.     Social History   Socioeconomic History  . Marital status: Single    Spouse name: Not on file  . Number of children: Not on file  . Years of education: Not on file  . Highest education level: Not on file  Occupational History  . Not on file  Social Needs  . Financial resource strain: Not on file  . Food insecurity:    Worry: Not on file    Inability: Not on file  . Transportation needs:    Medical: Not on file    Non-medical: Not on file  Tobacco Use  . Smoking status: Current Every Day Smoker    Packs/day: 0.50  . Smokeless tobacco: Never Used  Substance and Sexual Activity  . Alcohol use: Yes    Comment: Everyday. 6-12 beers  . Drug use: Yes    Types: Cocaine    Comment: Everyday. Has not used today. Last used Sunday.   Marland Kitchen Sexual activity: Yes  Lifestyle  . Physical activity:   Days per week: Not on file    Minutes per session: Not on file  . Stress: Not on file  Relationships  . Social connections:    Talks on phone: Not on file    Gets together: Not on file    Attends religious service: Not on file    Active member of club or organization: Not on file    Attends meetings of clubs or organizations: Not on file    Relationship status: Not on file  Other Topics Concern  . Not on file  Social History Narrative  . Not on file   Sleep: Fair  Appetite:  Fair  Current Medications: Current Facility-Administered Medications  Medication Dose Route Frequency Provider Last Rate Last Dose  . acetaminophen (TYLENOL) tablet 650 mg  650 mg Oral Q6H PRN Money, Gerlene Burdock, FNP      . alum & mag hydroxide-simeth (MAALOX/MYLANTA) 200-200-20 MG/5ML suspension 30 mL  30 mL Oral Q4H PRN Money, Gerlene Burdock, FNP      . [START ON 10/12/2018] buPROPion (WELLBUTRIN XL) 24 hr tablet 300 mg  300 mg Oral Daily Malvin Johns, MD      . chlordiazePOXIDE (LIBRIUM) capsule 25 mg  25 mg Oral Q6H PRN Money, Gerlene Burdock, FNP      . chlordiazePOXIDE (LIBRIUM) capsule 25 mg  25 mg Oral TID Money, Gerlene Burdock, FNP   25 mg  at 10/11/18 0749   Followed by  . chlordiazePOXIDE (LIBRIUM) capsule 25 mg  25 mg Oral BH-qamhs Money, Gerlene Burdock, FNP       Followed by  . [START ON 10/13/2018] chlordiazePOXIDE (LIBRIUM) capsule 25 mg  25 mg Oral Daily Money, Gerlene Burdock, FNP      . feeding supplement (ENSURE ENLIVE) (ENSURE ENLIVE) liquid 237 mL  237 mL Oral BID BM Cobos, Rockey Situ, MD   237 mL at 10/10/18 1259  . hydrOXYzine (ATARAX/VISTARIL) tablet 25 mg  25 mg Oral TID PRN Money, Gerlene Burdock, FNP   25 mg at 10/10/18 2126  . hydrOXYzine (ATARAX/VISTARIL) tablet 25 mg  25 mg Oral Q6H PRN Money, Gerlene Burdock, FNP      . loperamide (IMODIUM) capsule 2-4 mg  2-4 mg Oral PRN Money, Gerlene Burdock, FNP      . magnesium hydroxide (MILK OF MAGNESIA) suspension 30 mL  30 mL Oral Daily PRN Money, Gerlene Burdock, FNP      . multivitamin with minerals  tablet 1 tablet  1 tablet Oral Daily Money, Gerlene Burdock, FNP   1 tablet at 10/11/18 0749  . ondansetron (ZOFRAN-ODT) disintegrating tablet 4 mg  4 mg Oral Q6H PRN Money, Gerlene Burdock, FNP      . thiamine (B-1) injection 100 mg  100 mg Intramuscular Once Money, Feliz Beam B, FNP      . thiamine (VITAMIN B-1) tablet 100 mg  100 mg Oral Daily Money, Gerlene Burdock, FNP   100 mg at 10/11/18 0749  . traZODone (DESYREL) tablet 50 mg  50 mg Oral QHS PRN Money, Gerlene Burdock, FNP   50 mg at 10/10/18 2126    Lab Results:  Results for orders placed or performed during the hospital encounter of 10/09/18 (from the past 48 hour(s))  TSH     Status: None   Collection Time: 10/10/18  6:30 AM  Result Value Ref Range   TSH 1.563 0.350 - 4.500 uIU/mL    Comment: Performed by a 3rd Generation assay with a functional sensitivity of <=0.01 uIU/mL. Performed at [facility], [address] [place], [place] [address]     Blood Alcohol level:  Lab Results  Component Value Date   ETH <10 10/08/2018    Metabolic Disorder Labs: No results found for: HGBA1C, MPG No results found for: PROLACTIN No results found for: CHOL, TRIG, HDL, CHOLHDL, VLDL, LDLCALC  Physical Findings: AIMS: Facial and Oral Movements Muscles of Facial Expression: None, normal Lips and Perioral Area: None, normal Jaw: None, normal Tongue: None, normal,Extremity Movements Upper (arms, wrists, hands, fingers): None, normal Lower (legs, knees, ankles, toes): None, normal, Trunk Movements Neck, shoulders, hips: None, normal, Overall Severity Severity of abnormal movements (highest score from questions above): None, normal Incapacitation due to abnormal movements: None, normal Patient's awareness of abnormal movements (rate only patient's report): No Awareness, Dental Status Current problems with teeth and/or dentures?: No Does patient usually wear dentures?: No  CIWA:  CIWA-Ar Total: 1 COWS:     Musculoskeletal: Strength & Muscle  Tone: within normal limits Gait & Station: normal Patient leans: N/A  Psychiatric Specialty Exam: Physical Exam  ROS  Blood pressure 130/78, pulse 72, temperature 98.5 F (36.9 C), temperature source Oral, resp. rate 18, height 6\' 2"  (1.88 m), weight 79.4 kg.Body mass index is 22.47 kg/m.  General Appearance: Casual  Eye Contact:  Good  Speech:  Clear and Coherent  Volume:  Normal  Mood:  Dysphoric  Affect:  Congruent  Thought Process:  Coherent  Orientation:  Full (Time, Place, and Person)  Thought Content:  Logical  Suicidal Thoughts:  No  Homicidal Thoughts:  No  Memory:  Immediate;   Good  Judgement:  Good  Insight:  Good  Psychomotor Activity:  Normal  Concentration:  Concentration: Good  Recall:  Good  Fund of Knowledge:  Good  Language:  Good  Akathisia:  Negative  Handed:  Right  AIMS (if indicated):     Assets:  Physical Health Resilience Social Support  ADL's:  Intact  Cognition:  WNL  Sleep:  Number of Hours: 6.75     Treatment Plan Summary: Daily contact with patient to assess and evaluate symptoms and progress in treatment, Medication management and Plan Patient will continue on current Wellbutrin will escalate to 300 mg a day continue current Librium detox current precautions and groups no change in plans remains generally pleasant probable discharge 1 to 2 days  Malvin JohnsFARAH,Maudie Shingledecker, MD 10/11/2018, 8:12 AM

## 2018-10-11 NOTE — Tx Team (Signed)
Interdisciplinary Treatment and Diagnostic Plan Update  10/11/2018 Time of Session: 0830AM Keith Patton MRN: [id number]  Principal Diagnosis: MDD, recurrent, severe  Secondary Diagnoses: Active Problems:   MDD (major depressive disorder), severe (HCC)   Current Medications:  Current Facility-Administered Medications  Medication Dose Route Frequency Provider Last Rate Last Dose  . acetaminophen (TYLENOL) tablet 650 mg  650 mg Oral Q6H PRN Money, Gerlene Burdock, FNP      . alum & mag hydroxide-simeth (MAALOX/MYLANTA) 200-200-20 MG/5ML suspension 30 mL  30 mL Oral Q4H PRN Money, Gerlene Burdock, FNP      . [START ON 10/12/2018] buPROPion (WELLBUTRIN XL) 24 hr tablet 300 mg  300 mg Oral Daily Malvin Johns, MD      . chlordiazePOXIDE (LIBRIUM) capsule 25 mg  25 mg Oral Q6H PRN Money, Gerlene Burdock, FNP      . chlordiazePOXIDE (LIBRIUM) capsule 25 mg  25 mg Oral TID Money, Gerlene Burdock, FNP   25 mg at 10/11/18 3888   Followed by  . chlordiazePOXIDE (LIBRIUM) capsule 25 mg  25 mg Oral BH-qamhs Money, Gerlene Burdock, FNP       Followed by  . [START ON 10/13/2018] chlordiazePOXIDE (LIBRIUM) capsule 25 mg  25 mg Oral Daily Money, Gerlene Burdock, FNP      . feeding supplement (ENSURE ENLIVE) (ENSURE ENLIVE) liquid 237 mL  237 mL Oral BID BM Cobos, Rockey Situ, MD   237 mL at 10/10/18 1259  . hydrOXYzine (ATARAX/VISTARIL) tablet 25 mg  25 mg Oral TID PRN Money, Gerlene Burdock, FNP   25 mg at 10/10/18 2126  . hydrOXYzine (ATARAX/VISTARIL) tablet 25 mg  25 mg Oral Q6H PRN Money, Gerlene Burdock, FNP      . loperamide (IMODIUM) capsule 2-4 mg  2-4 mg Oral PRN Money, Gerlene Burdock, FNP      . magnesium hydroxide (MILK OF MAGNESIA) suspension 30 mL  30 mL Oral Daily PRN Money, Gerlene Burdock, FNP      . multivitamin with minerals tablet 1 tablet  1 tablet Oral Daily Money, Gerlene Burdock, FNP   1 tablet at 10/11/18 0749  . ondansetron (ZOFRAN-ODT) disintegrating tablet 4 mg  4 mg Oral Q6H PRN Money, Gerlene Burdock, FNP      . thiamine (B-1) injection 100 mg  100 mg  Intramuscular Once Money, Feliz Beam B, FNP      . thiamine (VITAMIN B-1) tablet 100 mg  100 mg Oral Daily Money, Gerlene Burdock, FNP   100 mg at 10/11/18 0749  . traZODone (DESYREL) tablet 50 mg  50 mg Oral QHS PRN Money, Gerlene Burdock, FNP   50 mg at 10/10/18 2126   PTA Medications: No medications prior to admission.    Patient Stressors: Loss of relationship Occupational concerns  Patient Strengths: Ability for insight Average or above average intelligence Capable of independent living Communication skills General fund of knowledge Motivation for treatment/growth Physical Health  Treatment Modalities: Medication Management, Group therapy, Case management,  1 to 1 session with clinician, Psychoeducation, Recreational therapy.   Physician Treatment Plan for Primary Diagnosis: MDD, recurrent, severe Long Term Goal(s): Improvement in symptoms so as ready for discharge Improvement in symptoms so as ready for discharge   Short Term Goals: Compliance with prescribed medications will improve Ability to identify triggers associated with substance abuse/mental health issues will improve Ability to maintain clinical measurements within normal limits will improve Compliance with prescribed medications will improve  Medication Management: Evaluate patient's response, side effects, and tolerance of medication regimen.  Therapeutic  Interventions: 1 to 1 sessions, Unit Group sessions and Medication administration.  Evaluation of Outcomes: Progressing  Physician Treatment Plan for Secondary Diagnosis: Active Problems:   MDD (major depressive disorder), severe (HCC)  Long Term Goal(s): Improvement in symptoms so as ready for discharge Improvement in symptoms so as ready for discharge   Short Term Goals: Compliance with prescribed medications will improve Ability to identify triggers associated with substance abuse/mental health issues will improve Ability to maintain clinical measurements within  normal limits will improve Compliance with prescribed medications will improve     Medication Management: Evaluate patient's response, side effects, and tolerance of medication regimen.  Therapeutic Interventions: 1 to 1 sessions, Unit Group sessions and Medication administration.  Evaluation of Outcomes: Progressing   RN Treatment Plan for Primary Diagnosis: MDD, recurrent, severe Long Term Goal(s): Knowledge of disease and therapeutic regimen to maintain health will improve  Short Term Goals: Ability to remain free from injury will improve, Ability to verbalize frustration and anger appropriately will improve and Ability to disclose and discuss suicidal ideas  Medication Management: RN will administer medications as ordered by provider, will assess and evaluate patient's response and provide education to patient for prescribed medication. RN will report any adverse and/or side effects to prescribing provider.  Therapeutic Interventions: 1 on 1 counseling sessions, Psychoeducation, Medication administration, Evaluate responses to treatment, Monitor vital signs and CBGs as ordered, Perform/monitor CIWA, COWS, AIMS and Fall Risk screenings as ordered, Perform wound care treatments as ordered.  Evaluation of Outcomes: Progressing   LCSW Treatment Plan for Primary Diagnosis:MDD, recurrent, severe Long Term Goal(s): Safe transition to appropriate next level of care at discharge, Engage patient in therapeutic group addressing interpersonal concerns.  Short Term Goals: Engage patient in aftercare planning with referrals and resources, Increase emotional regulation, Facilitate patient progression through stages of change regarding substance use diagnoses and concerns and Identify triggers associated with mental health/substance abuse issues  Therapeutic Interventions: Assess for all discharge needs, 1 to 1 time with Social worker, Explore available resources and support systems, Assess for  adequacy in community support network, Educate family and significant other(s) on suicide prevention, Complete Psychosocial Assessment, Interpersonal group therapy.  Evaluation of Outcomes: Progressing   Progress in Treatment: Attending groups: Yes. Participating in groups: Yes. Taking medication as prescribed: Yes. Toleration medication: Yes. Family/Significant other contact made:SPE completed with pt; pt declined to consent to collateral contact.  Patient understands diagnosis: Yes. Discussing patient identified problems/goals with staff: Yes. Medical problems stabilized or resolved: Yes. Denies suicidal/homicidal ideation: Yes Issues/concerns per patient self-inventory: No. Other: n/a   New problem(s) identified: No, Describe:  n/a  New Short Term/Long Term Goal(s): detox, medication management for mood stabilization; elimination of SI thoughts; development of comprehensive mental wellness/sobriety plan.   Patient Goals:  "to find the root cause of my depression and to detox."   Discharge Plan or Barriers: CSW assessing--pt plans to stay home. [facility]. [facility]. [facility]   Reason for Continuation of Hospitalization: Anxiety Depression Medication stabilization  Estimated Length of Stay: Saturday, 10/12/2018  Attendees: Patient: Keith GrateDatuan Patton 10/11/2018 8:44 AM  Physician: Dr. Jeannine KittenFarah MD 10/11/2018 8:44 AM  Nursing: Arlyss RepressAlyssa RN 10/11/2018 8:44 AM  RN Care Manager:x 10/11/2018 8:44 AM  Social Worker: Corrie MckusickHeather Chelle Cayton LCSW 10/11/2018 8:44 AM  Recreational Therapist: x 10/11/2018 8:44  AM  Other: Armandina Stammer NP; Marciano Sequin NP 10/11/2018 8:44 AM  Other:  10/11/2018 8:44 AM  Other: 10/11/2018 8:44 AM    Scribe for Treatment Team: Rona Ravens, LCSW 10/11/2018 8:44 AM

## 2018-10-11 NOTE — Progress Notes (Addendum)
Adult Psychoeducational Group Note  Date:  10/11/2018 Time:  9:09 AM  Group Topic/Focus:  Goals Group:   The focus of this group is to help patients establish daily goals to achieve during treatment and discuss how the patient can incorporate goal setting into their daily lives to aide in recovery.  Participation Level:  Did not attend  Participation Quality:  Did not attend  Affect:  Did not attend  Cognitive:  Did not attend  Insight: Did not attend  Engagement in Group:  Did not attend  Modes of Intervention: Did not attend  Additional Comments:  Patient did not attend goals group this morning. Patient was notified 15 minutes prior.  Delanee Xin L Lateefah Mallery 10/11/2018, 9:09 AM

## 2018-10-12 DIAGNOSIS — F1010 Alcohol abuse, uncomplicated: Secondary | ICD-10-CM

## 2018-10-12 DIAGNOSIS — F1410 Cocaine abuse, uncomplicated: Secondary | ICD-10-CM

## 2018-10-12 NOTE — Progress Notes (Addendum)
D. Pt is calm and cooperative- friendly during interactions- observed in the milieu interacting well with peers and staff. Per pt's self inventory, pt rates his depression, hopelessness and anxiety all 2's. Pt writes that his most important goal for today was ""Make the most of the day".  Pt currently denies SI/HI and AVH and agrees to contact staff before acting on any harmful thoughts.  A. Labs and vitals monitored. Pt compliant with medications. Pt supported emotionally and encouraged to express concerns and ask questions.   R. Pt remains safe with 15 minute checks. Will continue POC.
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
## 2018-10-12 NOTE — Progress Notes (Addendum)
[facility] MD Progress Note  10/12/2018 11:51 AM Keith Patton  MRN:  [id number] Subjective:  "I'm doing ok."  Mr. Keith Patton found resting in bed. Presents with bright affect and reports mood is improving. Denies withdrawal symptoms. He does report some concern for relapse on discharge. He plans to return to AA meetings and to spend time with his support network to help with sobriety. He reports still having good social support despite recent drug use. He is also eager to return to his job. Denies SI/HI/AVH. Denies medication side effects. He has been on Wellbutrin in the past and feels this medication is helpful. He has been participating in groups. He states readiness for discharge tomorrow.  From admission H&P: This is the first psychiatric admission here, the second lifetime admission for Mr. Keith Patton, a 33 year old single veteran who reports a history of depression that has been responsive in the past to Wellbutrin, however recent break-up and recent escalation in alcohol usage to daily dependency, and cocaine binging several times a week contributed to further depression, despondency, and recent suicidal thinking without specific plans. 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 There are no acute withdrawal symptoms or involuntary movements. Does not report cocaine cravings or dreams of use.  Principal Problem: MDD (major depressive disorder), severe (HCC) Diagnosis: Principal Problem:   MDD (major depressive disorder), severe (HCC) Active Problems:   Cocaine abuse (HCC)   Alcohol abuse  Total Time spent with patient: 15 minutes  Past Psychiatric History: See admission H&P  Past Medical History:  Past Medical History:  Diagnosis Date  . Depression    History reviewed. No pertinent surgical history. Family History: History reviewed. No pertinent family history. Family Psychiatric  History: See admission H&P Social History:  Social History   Substance and Sexual Activity   Alcohol Use Yes   Comment: Everyday. 6-12 beers     Social History   Substance and Sexual Activity  Drug Use Yes  . Types: Cocaine   Comment: Everyday. Has not used today. Last used Sunday.     Social History   Socioeconomic History  . Marital status: Single    Spouse name: Not on file  . Number of children: Not on file  . Years of education: Not on file  . Highest education level: Not on file  Occupational History  . Not on file  Social Needs  . Financial resource strain: Not on file  . Food insecurity:    Worry: Not on file    Inability: Not on file  . Transportation needs:    Medical: Not on file    Non-medical: Not on file  Tobacco Use  . Smoking status: Current Every Day Smoker    Packs/day: 0.50  . Smokeless tobacco: Never Used  Substance and Sexual Activity  . Alcohol use: Yes    Comment: Everyday. 6-12 beers  . Drug use: Yes    Types: Cocaine    Comment: Everyday. Has not used today. Last used Sunday.   Marland Kitchen Sexual activity: Yes  Lifestyle  . Physical activity:    Days per week: Not on file    Minutes per session: Not on file  . Stress: Not on file  Relationships  . Social connections:    Talks on phone: Not on file    Gets together: Not on file    Attends religious service: Not on file    Active member of club or organization: Not on file    Attends meetings of clubs  or organizations: Not on file    Relationship status: Not on file  Other Topics Concern  . Not on file  Social History Narrative  . Not on file   Additional Social History:                         Sleep: Fair  Appetite:  Good  Current Medications: Current Facility-Administered Medications  Medication Dose Route Frequency Provider Last Rate Last Dose  . acetaminophen (TYLENOL) tablet 650 mg  650 mg Oral Q6H PRN Money, Gerlene Burdock, FNP      . alum & mag hydroxide-simeth (MAALOX/MYLANTA) 200-200-20 MG/5ML suspension 30 mL  30 mL Oral Q4H PRN Money, Feliz Beam B, FNP      .  buPROPion (WELLBUTRIN XL) 24 hr tablet 300 mg  300 mg Oral Daily Malvin Johns, MD   300 mg at 10/12/18 0805  . chlordiazePOXIDE (LIBRIUM) capsule 25 mg  25 mg Oral Q6H PRN Money, Gerlene Burdock, FNP      . [START ON 10/13/2018] chlordiazePOXIDE (LIBRIUM) capsule 25 mg  25 mg Oral Daily Money, Gerlene Burdock, FNP      . feeding supplement (ENSURE ENLIVE) (ENSURE ENLIVE) liquid 237 mL  237 mL Oral BID BM Cobos, Fernando A, MD   237 mL at 10/12/18 1124  . hydrOXYzine (ATARAX/VISTARIL) tablet 25 mg  25 mg Oral TID PRN Money, Gerlene Burdock, FNP   25 mg at 10/10/18 2126  . hydrOXYzine (ATARAX/VISTARIL) tablet 25 mg  25 mg Oral Q6H PRN Money, Gerlene Burdock, FNP   25 mg at 10/11/18 2219  . loperamide (IMODIUM) capsule 2-4 mg  2-4 mg Oral PRN Money, Gerlene Burdock, FNP      . magnesium hydroxide (MILK OF MAGNESIA) suspension 30 mL  30 mL Oral Daily PRN Money, Gerlene Burdock, FNP      . multivitamin with minerals tablet 1 tablet  1 tablet Oral Daily Money, Gerlene Burdock, FNP   1 tablet at 10/12/18 0805  . ondansetron (ZOFRAN-ODT) disintegrating tablet 4 mg  4 mg Oral Q6H PRN Money, Gerlene Burdock, FNP      . thiamine (B-1) injection 100 mg  100 mg Intramuscular Once Money, Feliz Beam B, FNP      . thiamine (VITAMIN B-1) tablet 100 mg  100 mg Oral Daily Money, Gerlene Burdock, FNP   100 mg at 10/12/18 0805  . traZODone (DESYREL) tablet 50 mg  50 mg Oral QHS PRN Money, Gerlene Burdock, FNP   50 mg at 10/11/18 2219    Lab Results: No results found for this or any previous visit (from the past 48 hour(s)).  Blood Alcohol level:  Lab Results  Component Value Date   ETH <10 10/08/2018    Metabolic Disorder Labs: No results found for: HGBA1C, MPG No results found for: PROLACTIN No results found for: CHOL, TRIG, HDL, CHOLHDL, VLDL, LDLCALC  Physical Findings: AIMS: Facial and Oral Movements Muscles of Facial Expression: None, normal Lips and Perioral Area: None, normal Jaw: None, normal Tongue: None, normal,Extremity Movements Upper (arms, wrists, hands,  fingers): None, normal Lower (legs, knees, ankles, toes): None, normal, Trunk Movements Neck, shoulders, hips: None, normal, Overall Severity Severity of abnormal movements (highest score from questions above): None, normal Incapacitation due to abnormal movements: None, normal Patient's awareness of abnormal movements (rate only patient's report): No Awareness, Dental Status Current problems with teeth and/or dentures?: No Does patient usually wear dentures?: No  CIWA:  CIWA-Ar Total: 0 COWS:  Musculoskeletal: Strength & Muscle Tone: within normal limits Gait & Station: normal Patient leans: N/A  Psychiatric Specialty Exam: Physical Exam  Nursing note and vitals reviewed. Constitutional: He is oriented to person, place, and time. He appears well-developed and well-nourished.  Cardiovascular: Normal rate.  Respiratory: Effort normal.  Neurological: He is alert and oriented to person, place, and time.    Review of Systems  Constitutional: Negative.   Psychiatric/Behavioral: Positive for depression (improving) and substance abuse (ETOH, cocaine). Negative for hallucinations, memory loss and suicidal ideas. The patient is not nervous/anxious and does not have insomnia.     Blood pressure 116/85, pulse 83, temperature 98 F (36.7 C), resp. rate 18, height  (1.88 m), weight 79.4 kg.Body mass index is 22.47 kg/m.  General Appearance: Casual  Eye Contact:  Good  Speech:  Clear and Coherent  Volume:  Normal  Mood:  Euthymic  Affect:  Congruent and Full Range  Thought Process:  Coherent  Orientation:  Full (Time, Place, and Person)  Thought Content:  WDL  Suicidal Thoughts:  No  Homicidal Thoughts:  No  Memory:  Immediate;   Good Recent;   Good  Judgement:  Fair  Insight:  Good  Psychomotor Activity:  Normal  Concentration:  Concentration: Good  Recall:  Good  Fund of Knowledge:  Good  Language:  Good  Akathisia:  No  Handed:  Right  AIMS (if indicated):      Assets:  Communication Skills Desire for Improvement Housing Resilience Vocational/Educational  ADL's:  Intact  Cognition:  WNL  Sleep:  Number of Hours: 6     Treatment Plan Summary: Daily contact with patient to assess and evaluate symptoms and progress in treatment and Medication management   Continue inpatient hospitalization.  Continue Wellbutrin XL 300 mg PO daily for mood Continue Vistaril 25 mg PO TID PRN anxiety Continue Librium CIWA protocol for alcohol withdrawal Continue trazodone 50 mg PO QHS PRN insomnia  Patient will participate in the therapeutic group milieu.  Discharge disposition in progress.   Aldean Baker, NP 10/12/2018, 11:51 AM   ..Agree with NP Progress Note

## 2018-10-12 NOTE — Progress Notes (Signed)
D   Pt is pleasant on approach and cooperative   He said he is ready to be discharged and did not mention any problems he would have that might sabatoge his success at maintaining sobriety    Pt interactswell with others and is compliant with treatment A   Verbal support given   Medications administered and effectiveness monitored   Q 15 min checks R  Pt remains safe at this time

## 2018-10-12 NOTE — Progress Notes (Signed)
Patient did attend the evening speaker AA meeting.  

## 2018-10-12 NOTE — Progress Notes (Signed)
Adult Psychoeducational Group Note  Date:  10/12/2018 Time: 9:05am-10:05am  Group Topic/Focus:  Goals Group:   The focus of this group is to help patients establish daily goals to achieve during treatment and discuss how the patient can incorporate goal setting into their daily lives to aide in recovery.  Participation Level:  Active  Participation Quality:  Appropriate, Attentive and Sharing  Affect:  Appropriate  Cognitive:  Alert, Appropriate and Oriented  Insight: Appropriate  Engagement in Group:  Engaged  Modes of Intervention:  Discussion and Support  Additional Comments:  Patient informed the group that his goal for the day is to be active and think about how to reintegrate back to work. Patient informed the group that he will reach this goal by staying focused and avoiding triggers.  Patient informed the group that he would rate his day as a 9 so far. Patient informed the group that he is looking forward to the AA meeting .   Annye Asa 10/12/2018, 6:52 PM

## 2018-10-12 NOTE — [facility] Group Notes (Signed)
[facility] Group Notes:  (Nursing)  Date:  10/12/2018  Time: 1:15 PM Type of Therapy:  Nurse Education  Participation Level:  Active  Participation Quality:  Appropriate  Affect:  Appropriate  Cognitive:  Alert  Insight:  Appropriate  Engagement in Group:  Engaged  Modes of Intervention:  Education  Summary of Progress/Problems:  Identifying Needs   Shela Nevin 10/12/2018, 5:17 PM

## 2018-10-13 MED ORDER — BUPROPION HCL ER (XL) 300 MG PO TB24: 300.0000 mg | ORAL_TABLET | Freq: Every day | ORAL | 0 refills | Status: AC

## 2018-10-13 MED ORDER — TRAZODONE HCL 50 MG PO TABS: 50.0000 mg | ORAL_TABLET | Freq: Every evening | ORAL | 0 refills | Status: AC | PRN

## 2018-10-13 MED ORDER — HYDROXYZINE HCL 25 MG PO TABS: 25.0000 mg | ORAL_TABLET | Freq: Three times a day (TID) | ORAL | 0 refills | Status: AC | PRN

## 2018-10-13 NOTE — [facility] Group Notes (Signed)
[facility] Group Notes: (Clinical Social Work)   10/13/2018      Type of Therapy:  Group Therapy   Participation Level:  Did Not Attend despite MHT prompting   Wofford Stratton Grossman-Orr, LCSW 10/13/2018, 12:35 PM     

## 2018-10-13 NOTE — Progress Notes (Addendum)
D. Pt friendly upon approach- pleasant mood- reports that he's looking forward to discharge Per pt's self inventory, pt rates his depression, hopelessness and anxiety a 2/1/0, respectively. Pt writes that his most important goal today is "reintegrating into society , living life on life's terms" and writes that he will "stay busy" to help him to achieve that goal. Pt currently denies SI/HI and AV hallucinations. A. Labs and vitals monitored. Pt compliant with medications. Pt supported emotionally and encouraged to express concerns and ask questions.   R. Pt remains safe with 15 minute checks. Will continue POC.

## 2018-10-13 NOTE — Progress Notes (Signed)
Pt discharged to lobby- friend present to give patient a ride. Pt was stable and appreciative at that time. All papers  and valuables returned. Verbal understanding expressed. Denies SI/HI and A/VH. Pt given opportunity to express concerns and ask questions.

## 2018-10-13 NOTE — Discharge Summary (Addendum)
Physician Discharge Summary Note  Patient:  Keith Patton is an 33 y.o., male MRN:  [id number] DOB:  06-Dec-1985 Patient phone:  [phone] (home)  Patient address:   [address] [place] [address],  Total Time spent with patient: 15 minutes  Date of Admission:  10/09/2018 Date of Discharge: 10/13/2018  Reason for Admission:  Suicidal ideation, alcohol and cocaine abuse  Principal Problem: MDD (major depressive disorder), severe (HCC) Discharge Diagnoses: Principal Problem:   MDD (major depressive disorder), severe (HCC) Active Problems:   Cocaine abuse (HCC)   Alcohol abuse   Past Psychiatric History: Per admission H&P: 1 prior admission while at [facility], again believes that Wellbutrin has been helpful and requests no change in that medication  Past Medical History:  Past Medical History:  Diagnosis Date  . Depression    History reviewed. No pertinent surgical history. Family History: History reviewed. No pertinent family history. Family Psychiatric  History: Per admission H&P: neg Social History:  Social History   Substance and Sexual Activity  Alcohol Use Yes   Comment: Everyday. 6-12 beers     Social History   Substance and Sexual Activity  Drug Use Yes  . Types: Cocaine   Comment: Everyday. Has not used today. Last used Sunday.     Social History   Socioeconomic History  . Marital status: Single    Spouse name: Not on file  . Number of children: Not on file  . Years of education: Not on file  . Highest education level: Not on file  Occupational History  . Not on file  Social Needs  . Financial resource strain: Not on file  . Food insecurity:    Worry: Not on file    Inability: Not on file  . Transportation needs:    Medical: Not on file    Non-medical: Not on file  Tobacco Use  . Smoking status: Current Every Day Smoker    Packs/day: 0.50  . Smokeless tobacco: Never Used  Substance and Sexual Activity  . Alcohol use: Yes    Comment:  Everyday. 6-12 beers  . Drug use: Yes    Types: Cocaine    Comment: Everyday. Has not used today. Last used Sunday.   Marland Kitchen Sexual activity: Yes  Lifestyle  . Physical activity:    Days per week: Not on file    Minutes per session: Not on file  . Stress: Not on file  Relationships  . Social connections:    Talks on phone: Not on file    Gets together: Not on file    Attends religious service: Not on file    Active member of club or organization: Not on file    Attends meetings of clubs or organizations: Not on file    Relationship status: Not on file  Other Topics Concern  . Not on file  Social History Narrative  . Not on file    Hospital Course:  Per admission H&P 10/10/2018: This is the first psychiatric admission here, the second lifetime admission for Keith Patton, a 33 year old single veteran who reports a history of depression that has been responsive in the past to Wellbutrin, however recent break-up and recent escalation in alcohol usage to daily dependency, and cocaine binging several times a week contributed to further depression, despondency, and recent suicidal thinking without specific plans. 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 There are no acute withdrawal symptoms or involuntary movements. Does not report cocaine cravings or  dreams of use. He is alert and oriented to person place time day and situation, is generally cooperative pleasant to deal with denies specific plans of harming himself can contract for safety here but has had intermittent suicidal thoughts. No thoughts of harming others. No psychosis, no psychosis when abusing cocaine either.  Keith Patton for depression with suicidal ideation, in the context of alcohol and cocaine abuse. He was started on Librium CIWA protocol for alcohol withdrawal. He was started on Wellbutrin for depression. He participated in group therapy on the unit. He responded well to treatment with no adverse  effects reported. On day of discharge, he appears future-oriented, looks forward to return to work. He plans to become involved with AA to maintain sobriety. He remained on the [facility] unit for 4 days. He stabilized with medication and therapy. He was discharged on the medications listed below. He has shown improvement with improved mood, affect, sleep, appetite, and interaction. He denies any SI/HI/AVH and contracts for safety. He agrees to follow up at the [facility] (see below). Patient is provided with prescriptions for medications upon discharge. His friend is picking him up for discharge home.  Physical Findings: AIMS: Facial and Oral Movements Muscles of Facial Expression: None, normal Lips and Perioral Area: None, normal Jaw: None, normal Tongue: None, normal,Extremity Movements Upper (arms, wrists, hands, fingers): None, normal Lower (legs, knees, ankles, toes): None, normal, Trunk Movements Neck, shoulders, hips: None, normal, Overall Severity Severity of abnormal movements (highest score from questions above): None, normal Incapacitation due to abnormal movements: None, normal Patient's awareness of abnormal movements (rate only patient's report): No Awareness, Dental Status Current problems with teeth and/or dentures?: No Does patient usually wear dentures?: No  CIWA:  CIWA-Ar Total: 0 COWS:     Musculoskeletal: Strength & Muscle Tone: within normal limits Gait & Station: normal Patient leans: N/A  Psychiatric Specialty Exam: Physical Exam  Nursing note and vitals reviewed. Constitutional: He is oriented to person, place, and time. He appears well-developed and well-nourished.  Cardiovascular: Normal rate.  Respiratory: Effort normal.  Neurological: He is alert and oriented to person, place, and time.    Review of Systems  Constitutional: Negative.   Psychiatric/Behavioral: Positive for depression (improving) and substance abuse (ETOH, cocaine). Negative for  hallucinations, memory loss and suicidal ideas. The patient is not nervous/anxious and does not have insomnia.     Blood pressure 103/63, pulse 82, temperature 97.8 F (36.6 C), temperature source Oral, resp. rate 18, height 6\' 2"  (1.88 m), weight 79.4 kg.Body mass index is 22.47 kg/m.  See MD's discharge SRA     Have you used any form of tobacco in the last 30 days? (Cigarettes, Smokeless Tobacco, Cigars, and/or Pipes): Yes  Has this patient used any form of tobacco in the last 30 days? (Cigarettes, Smokeless Tobacco, Cigars, and/or Pipes)  No  Blood Alcohol level:  Lab Results  Component Value Date   ETH <10 10/08/2018    Metabolic Disorder Labs:  No results found for: HGBA1C, MPG No results found for: PROLACTIN No results found for: CHOL, TRIG, HDL, CHOLHDL, VLDL, LDLCALC  See Psychiatric Specialty Exam and Suicide Risk Assessment completed by Attending Physician prior to discharge.  Discharge destination:  Home  Is patient on multiple antipsychotic therapies at discharge:  No   Has Patient had three or more failed trials of antipsychotic monotherapy by history:  No  Recommended Plan for Multiple Antipsychotic Therapies: NA  Discharge Instructions    Discharge  instructions   Complete by:  As directed    Patient is instructed to take all prescribed medications as recommended. Report any side effects or adverse reactions to your outpatient psychiatrist. Patient is instructed to abstain from alcohol and illegal drugs while on prescription medications. In the event of worsening symptoms, patient is instructed to call the crisis hotline, 911, or go to the nearest emergency department for evaluation and treatment.     Allergies as of 10/13/2018   No Known Allergies     Medication List    TAKE these medications     Indication  buPROPion 300 MG 24 hr tablet Commonly known as:  WELLBUTRIN XL Take 1 tablet (300 mg total) by mouth daily. For mood  Indication:  Mood    hydrOXYzine 25 MG tablet Commonly known as:  ATARAX/VISTARIL Take 1 tablet (25 mg total) by mouth 3 (three) times daily as needed for anxiety.  Indication:  Feeling Anxious   traZODone 50 MG tablet Commonly known as:  DESYREL Take 1 tablet (50 mg total) by mouth at bedtime as needed for sleep.  Indication:  Trouble Sleeping      Follow-up Information    Clinic, [facility] Follow up on 10/16/2018.   Why:  Patient has to complete and update his means test during walk-in hours at the clinic in order to receive services. Please walk-in on Wednesday, 2/26 at 8:00a. Bring your photo ID, proof of insurance, SSN, current medications, and discharge paperwork.  Contact information: [address] [facility] [place] [address] [phone]        [facility], [facility] Follow up on 10/15/2018.   Why:  Hospital follow up appointment is 2/25 at 10:00a.  Please bring your photo ID, proof of insurance, SSN, current medications, and discharge paperwork from this hospitalization.  Contact information: [address] [place] [address] [phone]           Follow-up recommendations: Activity as tolerated. Diet as recommended by primary care physician. Keep all scheduled follow-up appointments as recommended.   Comments:   Patient is instructed to take all prescribed medications as recommended. Report any side effects or adverse reactions to your outpatient psychiatrist. Patient is instructed to abstain from alcohol and illegal drugs while on prescription medications. In the event of worsening symptoms, patient is instructed to call the crisis hotline, 911, or go to the nearest emergency department for evaluation and treatment.  Signed: Aldean Baker, NP 10/13/2018, 7:19 AM\  Patient seen, Suicide Assessment Completed.  Disposition Plan Reviewed

## 2018-10-13 NOTE — Progress Notes (Signed)
  [facility] Case Management Discharge Plan :  Will you be returning to the same living situation after discharge:  Yes,  alone in apartment At discharge, do you have transportation home?: Yes,  a friend Do you have the ability to pay for your medications: No.  Limited income and no insurance, [facility]  Release of information consent forms completed and turned in to Medical Records by CSW.   Patient to Follow up at: Follow-up Information    [facility] Follow up on 10/16/2018.   Why:  Patient has to complete and update his means test during walk-in hours at the clinic in order to receive services. Please walk-in on Wednesday, 2/26 at 8:00a. Bring your photo ID, proof of insurance, SSN, current medications, and discharge paperwork.  Contact information: [address] [facility] [place] [address] [phone]        [facility] Follow up on 10/15/2018.   Why:  Hospital follow up appointment is 2/25 at 10:00a.  Please bring your photo ID, proof of insurance, SSN, current medications, and discharge paperwork from this hospitalization.  Contact information: [address] [place] [address] [phone]           Next level of care provider has access to [facility] Link:no  Safety Planning and Suicide Prevention discussed: No.  Patient refused.  Reviewed with him.  Have you used any form of tobacco in the last 30 days? (Cigarettes, Smokeless Tobacco, Cigars, and/or Pipes): Yes  Has patient been referred to the Quitline?: Patient refused referral  Patient has been referred for addiction treatment: Yes  Lynnell Chad, LCSW 10/13/2018, 8:18 AM

## 2018-10-13 NOTE — [facility] Suicide Risk Assessment (Signed)
[facility] Discharge Suicide Risk Assessment   Principal Problem: MDD (major depressive disorder), severe (HCC) Discharge Diagnoses: Principal Problem:   MDD (major depressive disorder), severe (HCC) Active Problems:   Cocaine abuse (HCC)   Alcohol abuse   Total Time spent with patient: 30 minutes  Musculoskeletal: Strength & Muscle Tone: within normal limits- no tremors, no diaphoresis, no restlessness or agitation Gait & Station: normal Patient leans: N/A  Psychiatric Specialty Exam: ROS no chest pain, no shortness of breath, no vomiting   Blood pressure 103/63, pulse 82, temperature 97.8 F (36.6 C), temperature source Oral, resp. rate 18, height 6\' 2"  (1.88 m), weight 79.4 kg.Body mass index is 22.47 kg/m.  General Appearance: Well Groomed  Eye Contact::  Good  Speech:  Normal Rate409  Volume:  Normal  Mood:  reports feeling " a lot better", describes mood as 7/10 with 10 being best   Affect:  Appropriate and Full Range  Thought Process:  Linear and Descriptions of Associations: Intact  Orientation:  Other:  fully alert and attentive  Thought Content:  denies hallucinations, no delusions   Suicidal Thoughts:  No denies suicidal or self injurious ideations, denies homicidal or violent ideations  Homicidal Thoughts:  No  Memory:  recent and remote grossly intact   Judgement:  Other:  improving   Insight:  improving   Psychomotor Activity:  Normal  Concentration:  Good  Recall:  Good  Fund of Knowledge:Good  Language: Good  Akathisia:  Negative  Handed:  Right  AIMS (if indicated):     Assets:  Communication Skills Desire for Improvement Resilience  Sleep:  Number of Hours: 6.5  Cognition: WNL  ADL's:  Intact   Mental Status Per Nursing Assessment::   On Admission:  Self-harm thoughts  Demographic Factors:  32, single , has a 70 year old son, lives with mother out of state, patient lives alone, employed . Veteran.  Loss Factors: Substance abuse , death of loved  ones , recent break up   Historical Factors: One prior psychiatric admission for depression, no history of suicide attempts,  history of alcohol / cocaine use disorder   Risk Reduction Factors:   Employed and Positive coping skills or problem solving skills  Continued Clinical Symptoms:  Alert , attentive, well related, pleasant, mood improved , states he is feeling " a lot better" than he did on admission. Affect appropriate and reactive, no thought disorder, denies suicidal or self injurious ideations, denies homicidal or violent ideations, no psychotic symptoms, future oriented, plans to return to work soon. No current WDL symptoms noted or reported and vitals are current stable . Denies medication side effects. We reviewed side effect profile to include increased risk of seizures related to Wellbutrin- patient denies any history of seizures .  Cognitive Features That Contribute To Risk:  No gross cognitive deficits noted upon discharge. Is alert , attentive, and oriented x 3   Suicide Risk:  Mild:  Suicidal ideation of limited frequency, intensity, duration, and specificity.  There are no identifiable plans, no associated intent, mild dysphoria and related symptoms, good self-control (both objective and subjective assessment), few other risk factors, and identifiable protective factors, including available and accessible social support.  Follow-up Information    [facility] Follow up on 10/16/2018.   Why:  Patient has to complete and update his means test during walk-in hours at the clinic in order to receive services. Please walk-in on Wednesday, 2/26 at 8:00a. Bring your photo ID, proof of insurance,  SSN, current medications, and discharge paperwork.  Contact information: [address] [facility] [place] [address] [phone]        [facility] Follow up on 10/15/2018.   Why:  Hospital follow up appointment is 2/25 at 10:00a.  Please bring  your photo ID, proof of insurance, SSN, current medications, and discharge paperwork from this hospitalization.  Contact information: [address] [place] [address] [phone]           Plan Of Care/Follow-up recommendations:  Activity:  as tolerated  Diet:  regular Tests:  NA Other:  See below Patient expresses readiness for discharge and there are no current grounds for involuntary commitment Plans to return home Plans to follow up as above , and plans to go to AA regularly . Craige Cotta, MD 10/13/2018, 9:48 AM

## 2019-01-31 ENCOUNTER — Other Ambulatory Visit: Payer: Self-pay

## 2019-01-31 ENCOUNTER — Emergency Department (HOSPITAL_COMMUNITY): Payer: Self-pay

## 2019-01-31 ENCOUNTER — Emergency Department (HOSPITAL_COMMUNITY)
Admission: EM | Admit: 2019-01-31 | Discharge: 2019-01-31 | Payer: Self-pay | Attending: Emergency Medicine | Admitting: Emergency Medicine

## 2019-01-31 ENCOUNTER — Encounter (HOSPITAL_COMMUNITY): Payer: Self-pay

## 2019-01-31 DIAGNOSIS — S61412A Laceration without foreign body of left hand, initial encounter: Secondary | ICD-10-CM | POA: Insufficient documentation

## 2019-01-31 DIAGNOSIS — Z79899 Other long term (current) drug therapy: Secondary | ICD-10-CM | POA: Insufficient documentation

## 2019-01-31 DIAGNOSIS — Y929 Unspecified place or not applicable: Secondary | ICD-10-CM | POA: Insufficient documentation

## 2019-01-31 DIAGNOSIS — Y999 Unspecified external cause status: Secondary | ICD-10-CM | POA: Insufficient documentation

## 2019-01-31 DIAGNOSIS — F17210 Nicotine dependence, cigarettes, uncomplicated: Secondary | ICD-10-CM | POA: Insufficient documentation

## 2019-01-31 DIAGNOSIS — Y9389 Activity, other specified: Secondary | ICD-10-CM | POA: Insufficient documentation

## 2019-01-31 MED ORDER — AMOXICILLIN-POT CLAVULANATE 500-125 MG PO TABS
1.0000 | ORAL_TABLET | Freq: Once | ORAL | Status: AC
  Administered 2019-01-31: 06:00:00 500 mg via ORAL
  Filled 2019-01-31: qty 1

## 2019-01-31 MED ORDER — AMOXICILLIN-POT CLAVULANATE 500-125 MG PO TABS: 1.0000 | ORAL_TABLET | Freq: Three times a day (TID) | ORAL | 0 refills | Status: AC
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
## 2019-01-31 NOTE — Discharge Instructions (Addendum)
Augmentin as prescribed.  Local wound care with bacitracin and dressing changes twice daily.  Return to the emergency department for redness around the wound, pus draining from the wound, increased pain, or other new and concerning symptoms.
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
